# Patient Record
Sex: Male | Born: 2000 | Race: White | Hispanic: No | Marital: Single | Smoking: Current every day smoker
Health system: Southern US, Community
[De-identification: ages and names within clinical notes are randomized; demographics above are authoritative.]

## PROBLEM LIST (undated history)

## (undated) DIAGNOSIS — IMO0001 Reserved for inherently not codable concepts without codable children: Secondary | ICD-10-CM

## (undated) DIAGNOSIS — J020 Streptococcal pharyngitis: Secondary | ICD-10-CM

## (undated) DIAGNOSIS — F909 Attention-deficit hyperactivity disorder, unspecified type: Secondary | ICD-10-CM

## (undated) DIAGNOSIS — Z464 Encounter for fitting and adjustment of orthodontic device: Secondary | ICD-10-CM

## (undated) HISTORY — PX: NO PAST SURGERIES: SHX2092

---
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
## 2022-09-14 ENCOUNTER — Emergency Department: Payer: BC Managed Care – PPO

## 2022-09-14 ENCOUNTER — Emergency Department
Admission: EM | Admit: 2022-09-14 | Discharge: 2022-09-14 | Disposition: A | Discharge status: Home / Self Care | Payer: BC Managed Care – PPO | Attending: Emergency Medicine | Admitting: Emergency Medicine

## 2022-09-14 ENCOUNTER — Other Ambulatory Visit: Payer: Self-pay

## 2022-09-14 ENCOUNTER — Encounter: Payer: Self-pay | Admitting: *Deleted

## 2022-09-14 DIAGNOSIS — K439 Ventral hernia without obstruction or gangrene: Secondary | ICD-10-CM | POA: Insufficient documentation

## 2022-09-14 DIAGNOSIS — R109 Unspecified abdominal pain: Secondary | ICD-10-CM | POA: Diagnosis present

## 2022-09-14 DIAGNOSIS — M47816 Spondylosis without myelopathy or radiculopathy, lumbar region: Secondary | ICD-10-CM | POA: Insufficient documentation

## 2022-09-14 LAB — CBC
HCT: 47.6 % (ref 39.0–52.0)
Hemoglobin: 15.6 g/dL (ref 13.0–17.0)
MCH: 27.8 pg (ref 26.0–34.0)
MCHC: 32.8 g/dL (ref 30.0–36.0)
MCV: 84.7 fL (ref 80.0–100.0)
Platelets: 234 10*3/uL (ref 150–400)
RBC: 5.62 MIL/uL (ref 4.22–5.81)
RDW: 12.9 % (ref 11.5–15.5)
WBC: 7.9 10*3/uL (ref 4.0–10.5)
nRBC: 0 % (ref 0.0–0.2)

## 2022-09-14 LAB — LIPASE, BLOOD: Lipase: 30 U/L (ref 11–51)

## 2022-09-14 LAB — COMPREHENSIVE METABOLIC PANEL
ALT: 14 U/L (ref 0–44)
AST: 22 U/L (ref 15–41)
Albumin: 4.4 g/dL (ref 3.5–5.0)
Alkaline Phosphatase: 75 U/L (ref 38–126)
Anion gap: 7 (ref 5–15)
BUN: 8 mg/dL (ref 6–20)
CO2: 27 mmol/L (ref 22–32)
Calcium: 9.5 mg/dL (ref 8.9–10.3)
Chloride: 103 mmol/L (ref 98–111)
Creatinine, Ser: 0.94 mg/dL (ref 0.61–1.24)
GFR, Estimated: >60 mL/min (ref >60)
Glucose, Bld: 104 mg/dL — ABNORMAL HIGH (ref 70–99)
Potassium: 4 mmol/L (ref 3.5–5.1)
Sodium: 137 mmol/L (ref 135–145)
Total Bilirubin: 1.2 mg/dL (ref 0.3–1.2)
Total Protein: 7.7 g/dL (ref 6.5–8.1)

## 2022-09-14 NOTE — ED Triage Notes (Signed)
Pt sent from [facility] for eval of abd pain and back pain.  Pt had diarrhea x1.  No vomiting.  Denies urinary sx.  No known injury to back  Pt alert speech clear.

## 2022-09-14 NOTE — ED Triage Notes (Signed)
First Nurse Note:  Arrives from [facility] for evaluation of abdominal pain "for a while" worse in last week.  VS wnl.

## 2022-09-14 NOTE — ED Provider Notes (Signed)
[facility] Provider Note   Event Date/Time   First MD Initiated Contact with Patient 09/14/22 1742     (approximate) History  No chief complaint on file.  HPI Taylor Pitts is a 22 y.o. male with past medical history of umbilical hernia repair who presents complaining of midline anterior central abdominal pain that is worse when getting up from a seated position or straining/lifting.  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  Patient denies ever having a palpable mass or discoloration over this anterior area of his abdomen. ROS: Patient currently denies any vision changes, tinnitus, difficulty speaking, facial droop, sore throat, chest pain, shortness of breath, nausea/vomiting/diarrhea, dysuria, or weakness/numbness/paresthesias in any extremity   Physical Exam  Triage Vital Signs: ED Triage Vitals  Enc Vitals Group     BP 09/14/22 1649 104/70     Pulse Rate 09/14/22 1649 79     Resp 09/14/22 1649 18     Temp 09/14/22 1649 98.7 F (37.1 C)     Temp Source 09/14/22 1649 Oral     SpO2 09/14/22 1649 100 %     Weight 09/14/22 1646 173 lb (78.5 kg)     Height 09/14/22 1646 '5\' 8"'$  (1.727 m)     Head Circumference --      Peak Flow --      Pain Score 09/14/22 1646 7     Pain Loc --      Pain Edu? --      Excl. in [place]? --    Most recent vital signs: Vitals:   09/14/22 1649 09/14/22 1912  BP: 104/70 121/69  Pulse: 79 72  Resp: 18 16  Temp: 98.7 F (37.1 C) 97.8 F (36.6 C)  SpO2: 100% 99%   General: Awake, oriented x4. CV:  Good peripheral perfusion.  Resp:  Normal effort.  Abd:  No distention.  Small palpable midline supraumbilical ventral hernia easily reducible Other:  Young adult Caucasian male laying in bed in no acute distress ED Results / Procedures / Treatments  Labs (all labs ordered are listed, but only abnormal results are  displayed) Labs Reviewed  COMPREHENSIVE METABOLIC PANEL - Abnormal; Notable for the following components:      Result Value   Glucose, Bld 104 (*)    All other components within normal limits  LIPASE, BLOOD  CBC  URINALYSIS, ROUTINE W REFLEX MICROSCOPIC  RADIOLOGY ED MD interpretation: CT of the abdomen and pelvis without IV contrast shows a small fat-containing supraumbilical midline ventral hernia.  There is no other evidence of acute abnormalities -Agree with radiology assessment Official radiology report(s): CT ABDOMEN PELVIS WO CONTRAST  Result Date: 09/14/2022 CLINICAL DATA:  Abdominal and back pain, diarrhea EXAM: CT ABDOMEN AND PELVIS WITHOUT CONTRAST TECHNIQUE: Multidetector CT imaging of the abdomen and pelvis was performed following the standard protocol without IV contrast. Unenhanced CT was performed per clinician order. Lack of IV contrast limits sensitivity and specificity, especially for evaluation of abdominal/pelvic solid viscera. RADIATION DOSE REDUCTION: This exam was performed according to the departmental dose-optimization program which includes automated exposure control, adjustment of the mA and/or kV according to patient size and/or use of iterative reconstruction technique. COMPARISON:  None Available. FINDINGS: Lower chest: No acute pleural or parenchymal lung disease. Hepatobiliary: Unremarkable unenhanced appearance of the liver and gallbladder. Pancreas: Unremarkable unenhanced appearance. Spleen: Unremarkable unenhanced appearance. Adrenals/Urinary Tract:  No urinary tract calculi or obstructive uropathy. The adrenals are unremarkable. Evaluation of the bladder is limited due to decompressed state. Stomach/Bowel: No bowel obstruction or ileus. Normal appendix right lower quadrant. No bowel wall thickening or inflammatory change. Vascular/Lymphatic: No significant vascular findings are present. No enlarged abdominal or pelvic lymph nodes. Reproductive: Prostate is  unremarkable. Other: No free fluid or free intraperitoneal gas. Small fat containing supraumbilical midline ventral hernia. No bowel herniation. Musculoskeletal: No acute or destructive bony lesions. Mild L5-S1 spondylosis with broad-based disc bulge resulting in mild left lateral recess and neural foraminal encroachment. Metallic BB within the left gluteal region. Reconstructed images demonstrate no additional findings. IMPRESSION: 1. Unremarkable unenhanced CT of the abdomen and pelvis. Normal appendix. No urinary tract calculi. 2. Mild spondylosis at L5-S1, with the left lateral recess and neural foraminal encroachment. 3. Small fat containing supraumbilical midline ventral hernia. No evidence of complication. Electronically Signed   By: Randa Ngo M.D.   On: 09/14/2022 18:36   PROCEDURES: Critical Care performed: No Procedures MEDICATIONS ORDERED IN ED: Medications - No data to display IMPRESSION / MDM / [place] / ED COURSE  I reviewed the triage vital signs and the nursing notes.                             Patient's presentation is most consistent with acute presentation with potential threat to life or bodily function. Patient presents for abdominal pain.  Differential diagnosis includes appendicitis, abdominal aortic aneurysm, surgical biliary disease, pancreatitis, SBO, mesenteric ischemia, serious intra-abdominal bacterial illness, genital torsion. Doubt atypical ACS. Based on history, physical exam, radiologic/laboratory evaluation, there is no red flag results or symptomatology requiring emergent intervention or need for admission at this time Pt tolerating PO. Patient given contact for follow-up with general surgery for ventral hernia repair as needed. Disposition: Patient will be discharged with strict return precautions and follow up with primary MD within 12-24 hours for further evaluation. Patient understands that this still may have an early presentation of an  emergent medical condition such as appendicitis that will require a recheck.   FINAL CLINICAL IMPRESSION(S) / ED DIAGNOSES   Final diagnoses:  Ventral hernia without obstruction or gangrene  Spondylosis of lumbar spine   Rx / DC Orders   ED Discharge Orders     None      Note:  This document was prepared using Dragon voice recognition software and may include unintentional dictation errors.   Naaman Plummer, MD 09/15/22 0000

## 2023-07-27 ENCOUNTER — Emergency Department: Payer: BC Managed Care – PPO

## 2023-07-27 ENCOUNTER — Emergency Department
Admission: EM | Admit: 2023-07-27 | Discharge: 2023-07-27 | Disposition: A | Discharge status: Home / Self Care | Payer: BC Managed Care – PPO | Attending: Emergency Medicine | Admitting: Emergency Medicine

## 2023-07-27 ENCOUNTER — Encounter: Payer: Self-pay | Admitting: Emergency Medicine

## 2023-07-27 ENCOUNTER — Other Ambulatory Visit: Payer: Self-pay

## 2023-07-27 DIAGNOSIS — J45909 Unspecified asthma, uncomplicated: Secondary | ICD-10-CM | POA: Diagnosis not present

## 2023-07-27 DIAGNOSIS — N50811 Right testicular pain: Secondary | ICD-10-CM | POA: Insufficient documentation

## 2023-07-27 DIAGNOSIS — N433 Hydrocele, unspecified: Secondary | ICD-10-CM

## 2023-07-27 LAB — URINALYSIS, ROUTINE W REFLEX MICROSCOPIC
Bilirubin Urine: NEGATIVE
Glucose, UA: NEGATIVE mg/dL
Hgb urine dipstick: NEGATIVE
Ketones, ur: NEGATIVE mg/dL
Leukocytes,Ua: NEGATIVE
Nitrite: NEGATIVE
Protein, ur: NEGATIVE mg/dL
Specific Gravity, Urine: 1.021 (ref 1.005–1.030)
pH: 5 (ref 5.0–8.0)

## 2023-07-27 NOTE — ED Triage Notes (Signed)
First Nurse Note: Patient to ED via POV from [facility] for right sided groin pain. Denies redness or swelling. Hx of hernia

## 2023-07-27 NOTE — ED Notes (Signed)
See triage note  Presents with pain which starts at upper thigh  and moves into groin  Denies any urinary sxs'

## 2023-07-27 NOTE — ED Triage Notes (Signed)
Pt here with groin pain since yesterday. Pt denies burning or bleeding with urination. Pt states his pain starts in his upper thigh and radiates to his testicles.

## 2023-07-27 NOTE — ED Provider Notes (Signed)
   [facility] Provider Note    Event Date/Time   First MD Initiated Contact with Patient 07/27/23 1335     (approximate)  History   Chief Complaint: Groin Pain  HPI  Taylor Pitts is a 23 y.o. male with a past medical history of ADHD, asthma, presents to the emergency department for right testicular pain.  According to the patient since last night he has been experiencing a mild discomfort in the right groin and down into the right testicle.  Patient denies any swelling to the scrotum or testicle.  Denies any erythema.  Patient denies any dysuria or hematuria.  Denies any penile discharge.  Physical Exam   Triage Vital Signs: ED Triage Vitals  Encounter Vitals Group     BP 07/27/23 1136 125/79     Systolic BP Percentile --      Diastolic BP Percentile --      Pulse Rate 07/27/23 1136 80     Resp 07/27/23 1136 17     Temp 07/27/23 1136 98.9 F (37.2 C)     Temp Source 07/27/23 1136 Oral     SpO2 07/27/23 1136 100 %     Weight 07/27/23 1137 190 lb (86.2 kg)     Height 07/27/23 1137 5\' 8"  (1.727 m)     Head Circumference --      Peak Flow --      Pain Score 07/27/23 1142 5     Pain Loc --      Pain Education --      Exclude from Growth Chart --     Most recent vital signs: Vitals:   07/27/23 1136  BP: 125/79  Pulse: 80  Resp: 17  Temp: 98.9 F (37.2 C)  SpO2: 100%    General: Awake, no distress.  CV:  Good peripheral perfusion.  Regular rate and rhythm  Resp:  Normal effort.  Equal breath sounds bilaterally.  Abd:  No distention.  Soft, nontender.   Other:  Normal-appearing scrotal and testicular exam.  No testicular enlargement noted.  No hernia masses palpated.  No fullness within the inguinal canal.   ED Results / Procedures / Treatments   RADIOLOGY  Ultrasound pending   MEDICATIONS ORDERED IN ED: Medications - No data to display   IMPRESSION / MDM / ASSESSMENT AND PLAN / ED COURSE  I reviewed the triage vital signs and  the nursing notes.  Patient's presentation is most consistent with acute presentation with potential threat to life or bodily function.  Patient presents to the emergency department for right testicular pain ongoing since last night.  Patient denies any known trauma to the area.  States the mild discomfort, he is concerned because a friend of his had a testicular torsion and he was worried regarding this.  Denies any discharge denies any dysuria.  Patient's urinalysis shows normal results.  Will obtain a scrotal/testicular ultrasound with [employer] to rule out any other concerning abnormality.  Reassuring physical exam.  Ultrasound pending patient care signed out to oncoming provider.  FINAL CLINICAL IMPRESSION(S) / ED DIAGNOSES   Right testicular pain   Note:  This document was prepared using Dragon voice recognition software and may include unintentional dictation errors.   Minna Antis, MD 07/27/23 [id number]

## 2023-07-27 NOTE — Discharge Instructions (Addendum)
Please use ibuprofen (Motrin) up to 800 mg every 8 hours, naproxen (Naprosyn) up to 500 mg every 12 hours, and/or acetaminophen (Tylenol) up to 4 g/day for any continued pain.  Please do not use this medication regimen for longer than 7 days
